# Patient Record
Sex: Female | Born: 1980 | Race: Black or African American | Hispanic: No | Marital: Single | Smoking: Never smoker
Health system: Southern US, Community
[De-identification: ages and names within clinical notes are randomized; demographics above are authoritative.]

## PROBLEM LIST (undated history)

## (undated) DIAGNOSIS — F32A Depression, unspecified: Secondary | ICD-10-CM

## (undated) DIAGNOSIS — F329 Major depressive disorder, single episode, unspecified: Secondary | ICD-10-CM

## (undated) DIAGNOSIS — T7840XA Allergy, unspecified, initial encounter: Secondary | ICD-10-CM

## (undated) DIAGNOSIS — F419 Anxiety disorder, unspecified: Secondary | ICD-10-CM

## (undated) DIAGNOSIS — D649 Anemia, unspecified: Secondary | ICD-10-CM

## (undated) HISTORY — DX: Anxiety disorder, unspecified: F41.9

## (undated) HISTORY — DX: Anemia, unspecified: D64.9

## (undated) HISTORY — DX: Major depressive disorder, single episode, unspecified: F32.9

## (undated) HISTORY — DX: Depression, unspecified: F32.A

## (undated) HISTORY — DX: Allergy, unspecified, initial encounter: T78.40XA

---

## 2000-11-16 ENCOUNTER — Emergency Department (HOSPITAL_COMMUNITY): Admission: EM | Admit: 2000-11-16 | Discharge: 2000-11-16 | Payer: Self-pay | Admitting: Emergency Medicine

## 2007-08-18 ENCOUNTER — Ambulatory Visit (HOSPITAL_COMMUNITY): Admission: RE | Admit: 2007-08-18 | Discharge: 2007-08-18 | Payer: Self-pay | Admitting: Family Medicine

## 2010-03-19 ENCOUNTER — Encounter: Payer: Self-pay | Admitting: Family Medicine

## 2012-09-30 ENCOUNTER — Encounter (HOSPITAL_COMMUNITY): Payer: Self-pay | Admitting: Emergency Medicine

## 2012-09-30 ENCOUNTER — Emergency Department (HOSPITAL_COMMUNITY)
Admission: EM | Admit: 2012-09-30 | Discharge: 2012-09-30 | Disposition: A | Discharge status: Home / Self Care | Payer: No Typology Code available for payment source | Attending: Emergency Medicine | Admitting: Emergency Medicine

## 2012-09-30 DIAGNOSIS — IMO0002 Reserved for concepts with insufficient information to code with codable children: Secondary | ICD-10-CM | POA: Insufficient documentation

## 2012-09-30 DIAGNOSIS — S8990XA Unspecified injury of unspecified lower leg, initial encounter: Secondary | ICD-10-CM | POA: Insufficient documentation

## 2012-09-30 DIAGNOSIS — Y92410 Unspecified street and highway as the place of occurrence of the external cause: Secondary | ICD-10-CM | POA: Insufficient documentation

## 2012-09-30 DIAGNOSIS — S46909A Unspecified injury of unspecified muscle, fascia and tendon at shoulder and upper arm level, unspecified arm, initial encounter: Secondary | ICD-10-CM | POA: Insufficient documentation

## 2012-09-30 DIAGNOSIS — S0990XA Unspecified injury of head, initial encounter: Secondary | ICD-10-CM | POA: Insufficient documentation

## 2012-09-30 DIAGNOSIS — S4980XA Other specified injuries of shoulder and upper arm, unspecified arm, initial encounter: Secondary | ICD-10-CM | POA: Insufficient documentation

## 2012-09-30 DIAGNOSIS — Y9389 Activity, other specified: Secondary | ICD-10-CM | POA: Insufficient documentation

## 2012-09-30 DIAGNOSIS — S0993XA Unspecified injury of face, initial encounter: Secondary | ICD-10-CM | POA: Insufficient documentation

## 2012-09-30 DIAGNOSIS — Z79899 Other long term (current) drug therapy: Secondary | ICD-10-CM | POA: Insufficient documentation

## 2012-09-30 MED ORDER — IBUPROFEN 800 MG PO TABS: 800.0000 mg | ORAL_TABLET | Freq: Three times a day (TID) | ORAL | Status: DC

## 2012-09-30 MED ORDER — METHOCARBAMOL 500 MG PO TABS: 500.0000 mg | ORAL_TABLET | Freq: Two times a day (BID) | ORAL | Status: DC
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
## 2012-09-30 NOTE — Progress Notes (Signed)
[facility] provided patient with a [employer].
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
## 2012-09-30 NOTE — ED Provider Notes (Signed)
Medical screening examination/treatment/procedure(s) were performed by non-physician practitioner and as supervising physician I was immediately available for consultation/collaboration.   Audree Camel, MD 09/30/12 [phone]

## 2012-12-17 ENCOUNTER — Encounter: Payer: Self-pay | Admitting: Family Medicine

## 2012-12-17 ENCOUNTER — Ambulatory Visit (INDEPENDENT_AMBULATORY_CARE_PROVIDER_SITE_OTHER): Payer: 59 | Admitting: Family Medicine

## 2012-12-17 ENCOUNTER — Encounter: Payer: Self-pay | Admitting: Internal Medicine

## 2012-12-17 VITALS — BP 102/60 | HR 80 | Temp 98.7°F | Resp 16 | Ht 62.0 in | Wt 123.8 lb

## 2012-12-17 DIAGNOSIS — Z862 Personal history of diseases of the blood and blood-forming organs and certain disorders involving the immune mechanism: Secondary | ICD-10-CM

## 2012-12-17 DIAGNOSIS — R5381 Other malaise: Secondary | ICD-10-CM

## 2012-12-17 DIAGNOSIS — K5900 Constipation, unspecified: Secondary | ICD-10-CM

## 2012-12-17 LAB — CBC WITH DIFFERENTIAL/PLATELET
Basophils Absolute: 0 10*3/uL (ref 0.0–0.1)
Basophils Relative: 1 % (ref 0–1)
Eosinophils Relative: 2 % (ref 0–5)
HCT: 36.8 % (ref 36.0–46.0)
Hemoglobin: 12.7 g/dL (ref 12.0–15.0)
Lymphocytes Relative: 38 % (ref 12–46)
Lymphs Abs: 2 10*3/uL (ref 0.7–4.0)
MCH: 28.9 pg (ref 26.0–34.0)
MCHC: 34.5 g/dL (ref 30.0–36.0)
Monocytes Absolute: 0.3 10*3/uL (ref 0.1–1.0)
Neutro Abs: 2.9 10*3/uL (ref 1.7–7.7)
Neutrophils Relative %: 54 % (ref 43–77)
RDW: 14 % (ref 11.5–15.5)
WBC: 5.3 10*3/uL (ref 4.0–10.5)

## 2012-12-17 LAB — POCT URINALYSIS DIPSTICK
Bilirubin, UA: NEGATIVE
Ketones, UA: NEGATIVE
Leukocytes, UA: NEGATIVE
Spec Grav, UA: <=1.005
pH, UA: 5.5

## 2012-12-17 NOTE — Patient Instructions (Addendum)
Fatigue Fatigue is a feeling of tiredness, lack of energy, lack of motivation, or feeling tired all the time. Having enough rest, good nutrition, and reducing stress will normally reduce fatigue. Consult your caregiver if it persists. The nature of your fatigue will help your caregiver to find out its cause. The treatment is based on the cause.  CAUSES  There are many causes for fatigue. Most of the time, fatigue can be traced to one or more of your habits or routines. Most causes fit into one or more of three general areas. They are: Lifestyle problems  Sleep disturbances.  Overwork.  Physical exertion.  Unhealthy habits.  Poor eating habits or eating disorders.  Alcohol and/or drug use .  Lack of proper nutrition (malnutrition). Psychological problems  Stress and/or anxiety problems.  Depression.  Grief.  Boredom. Medical Problems or Conditions  Anemia.  Pregnancy.  Thyroid gland problems.  Recovery from major surgery.  Continuous pain.  Emphysema or asthma that is not well controlled  Allergic conditions.  Diabetes.  Infections (such as mononucleosis).  Obesity.  Sleep disorders, such as sleep apnea.  Heart failure or other heart-related problems.  Cancer.  Kidney disease.  Liver disease.  Effects of certain medicines such as antihistamines, cough and cold remedies, prescription pain medicines, heart and blood pressure medicines, drugs used for treatment of cancer, and some antidepressants. SYMPTOMS  The symptoms of fatigue include:   Lack of energy.  Lack of drive (motivation).  Drowsiness.  Feeling of indifference to the surroundings. DIAGNOSIS  The details of how you feel help guide your caregiver in finding out what is causing the fatigue. You will be asked about your present and past health condition. It is important to review all medicines that you take, including prescription and non-prescription items. A thorough exam will be done.  You will be questioned about your feelings, habits, and normal lifestyle. Your caregiver may suggest blood tests, urine tests, or other tests to look for common medical causes of fatigue.  TREATMENT  Fatigue is treated by correcting the underlying cause. For example, if you have continuous pain or depression, treating these causes will improve how you feel. Similarly, adjusting the dose of certain medicines will help in reducing fatigue.  HOME CARE INSTRUCTIONS   Try to get the required amount of good sleep every night.  Eat a healthy and nutritious diet, and drink enough water throughout the day.  Practice ways of relaxing (including yoga or meditation).  Exercise regularly.  Make plans to change situations that cause stress. Act on those plans so that stresses decrease over time. Keep your work and personal routine reasonable.  Avoid street drugs and minimize use of alcohol.  Start taking a daily multivitamin after consulting your caregiver. SEEK MEDICAL CARE IF:   You have persistent tiredness, which cannot be accounted for.  You have fever.  You have unintentional weight loss.  You have headaches.  You have disturbed sleep throughout the night.  You are feeling sad.  You have constipation.  You have dry skin.  You have gained weight.  You are taking any new or different medicines that you suspect are causing fatigue.  You are unable to sleep at night.  You develop any unusual swelling of your legs or other parts of your body. SEEK IMMEDIATE MEDICAL CARE IF:   You are feeling confused.  Your vision is blurred.  You feel faint or pass out.  You develop severe headache.  You develop severe abdominal, pelvic, or   back pain.  You develop chest pain, shortness of breath, or an irregular or fast heartbeat.  You are unable to pass a normal amount of urine.  You develop abnormal bleeding such as bleeding from the rectum or you vomit blood.  You have thoughts  about harming yourself or committing suicide.  You are worried that you might harm someone else. MAKE SURE YOU:   Understand these instructions.  Will watch your condition.  Will get help right away if you are not doing well or get worse. Document Released: 12/10/2006 Document Revised: 05/07/2011 Document Reviewed: 12/10/2006 [facility] Patient Information 2014 [place], [place].    Constipation, Adult Constipation is when a person has fewer than 3 bowel movements a week; has difficulty having a bowel movement; or has stools that are dry, hard, or larger than normal. As people grow older, constipation is more common. If you try to fix constipation with medicines that make you have a bowel movement (laxatives), the problem may get worse. Long-term laxative use may cause the muscles of the colon to become weak. A low-fiber diet, not taking in enough fluids, and taking certain medicines may make constipation worse. CAUSES   Certain medicines, such as antidepressants, pain medicine, iron supplements, antacids, and water pills.   Certain diseases, such as diabetes, irritable bowel syndrome (IBS), thyroid disease, or depression.   Not drinking enough water.   Not eating enough fiber-rich foods.   Stress or travel.  Lack of physical activity or exercise.  Not going to the restroom when there is the urge to have a bowel movement.  Ignoring the urge to have a bowel movement.  Using laxatives too much. SYMPTOMS   Having fewer than 3 bowel movements a week.   Straining to have a bowel movement.   Having hard, dry, or larger than normal stools.   Feeling full or bloated.   Pain in the lower abdomen.  Not feeling relief after having a bowel movement. DIAGNOSIS  Your caregiver will take a medical history and perform a physical exam. Further testing may be done for severe constipation. Some tests may include:   A barium enema X-ray to examine your rectum, colon, and  sometimes, your small intestine.  A sigmoidoscopy to examine your lower colon.  A colonoscopy to examine your entire colon. TREATMENT  Treatment will depend on the severity of your constipation and what is causing it. Some dietary treatments include drinking more fluids and eating more fiber-rich foods. Lifestyle treatments may include regular exercise. If these diet and lifestyle recommendations do not help, your caregiver may recommend taking over-the-counter laxative medicines to help you have bowel movements. Prescription medicines may be prescribed if over-the-counter medicines do not work.  HOME CARE INSTRUCTIONS   Increase dietary fiber in your diet, such as fruits, vegetables, whole grains, and beans. Limit high-fat and processed sugars in your diet, such as [place] fries, hamburgers, cookies, candies, and soda.   A fiber supplement may be added to your diet if you cannot get enough fiber from foods.   Drink enough fluids to keep your urine clear or pale yellow.   Exercise regularly or as directed by your caregiver.   Go to the restroom when you have the urge to go. Do not hold it.  Only take medicines as directed by your caregiver. Do not take other medicines for constipation without talking to your caregiver first. SEEK IMMEDIATE MEDICAL CARE IF:   You have bright red blood in your stool.   Your constipation  lasts for more than 4 days or gets worse.   You have abdominal or rectal pain.   You have thin, pencil-like stools.  You have unexplained weight loss. MAKE SURE YOU:   Understand these instructions.  Will watch your condition.  Will get help right away if you are not doing well or get worse. Document Released: 11/11/2003 Document Revised: 05/07/2011 Document Reviewed: 01/16/2011 [facility] Patient Information 2014 [place], [place].

## 2012-12-18 LAB — COMPREHENSIVE METABOLIC PANEL
ALT: <8 U/L (ref 0–35)
AST: 11 U/L (ref 0–37)
Albumin: 4.2 g/dL (ref 3.5–5.2)
Alkaline Phosphatase: 133 U/L — ABNORMAL HIGH (ref 39–117)
BUN: 6 mg/dL (ref 6–23)
Calcium: 9.1 mg/dL (ref 8.4–10.5)
Chloride: 105 mEq/L (ref 96–112)
Potassium: 4.3 mEq/L (ref 3.5–5.3)
Sodium: 138 mEq/L (ref 135–145)
Total Bilirubin: 0.5 mg/dL (ref 0.3–1.2)
Total Protein: 7.6 g/dL (ref 6.0–8.3)

## 2012-12-18 LAB — VITAMIN D 25 HYDROXY (VIT D DEFICIENCY, FRACTURES): Vit D, 25-Hydroxy: 16 ng/mL — ABNORMAL LOW (ref 30–89)

## 2012-12-18 NOTE — Progress Notes (Signed)
Subjective:    Patient ID: Brooke Sexton, female    DOB: Jun 15, 1980, 32 y.o.   MRN: [id number]  HPI  This 32 y.o. AA female is here to re-establish care; last CPE/PAP ~5 years ago. Pt is concerned about fatigue, "digestive problems" and chronic constipation and hair loss which runs in her family among female relatives.  Menses are regular w/ normal bleeding; pt has hx of anemia in remote past.  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 SHe acknowledges that it is a stressful profession.  PMHx, Soc Hx and Fam Hx reviewed.  Review of Systems  Constitutional: Positive for fatigue. Negative for fever, diaphoresis, activity change, appetite change and unexpected weight change.  HENT: Negative.   Eyes: Negative.   Respiratory: Negative.   Cardiovascular: Negative.   Gastrointestinal: Positive for constipation and abdominal distention. Negative for nausea, vomiting, diarrhea and blood in stool.  Endocrine: Positive for cold intolerance.  Genitourinary: Negative.   Musculoskeletal: Negative.   Skin: Negative.   Neurological: Negative.   Hematological: Negative.   Psychiatric/Behavioral: Negative.        Objective:   Physical Exam  Nursing note and vitals reviewed. Constitutional: She is oriented to person, place, and time. Vital signs are normal. She appears well-developed and well-nourished. No distress.  HENT:  Head: Normocephalic and atraumatic.  Right Ear: Hearing, tympanic membrane, external ear and ear canal normal.  Left Ear: Hearing, tympanic membrane, external ear and ear canal normal.  Nose: Nose normal. No nasal deformity or septal deviation. Right sinus exhibits no maxillary sinus tenderness and no frontal sinus tenderness. Left sinus exhibits no maxillary sinus tenderness and no frontal sinus tenderness.  Mouth/Throat: Uvula is midline, oropharynx is clear and moist and mucous membranes are normal. No oral lesions. Normal  dentition. No dental caries.  Eyes: Conjunctivae, EOM and lids are normal. No scleral icterus.  Fundoscopic exam:      The right eye shows no arteriolar narrowing, no AV nicking and no papilledema. The right eye shows red reflex.       The left eye shows no arteriolar narrowing, no AV nicking and no papilledema. The left eye shows red reflex.  Neck: Normal range of motion and full passive range of motion without pain. Neck supple. No spinous process tenderness and no muscular tenderness present. No mass and no thyromegaly present.  Cardiovascular: Normal rate, regular rhythm, S1 normal, S2 normal, normal heart sounds, intact distal pulses and normal pulses.   No extrasystoles are present. PMI is not displaced.  Exam reveals no gallop and no friction rub.   No murmur heard. Pulmonary/Chest: Effort normal and breath sounds normal. No respiratory distress.  Abdominal: Soft. Normal appearance and bowel sounds are normal. She exhibits no distension and no mass. There is no tenderness. There is no guarding and no CVA tenderness.  Musculoskeletal: Normal range of motion. She exhibits no edema and no tenderness.  Lymphadenopathy:       Head (right side): No submental, no submandibular, no tonsillar, no posterior auricular and no occipital adenopathy present.       Head (left side): No submental, no submandibular, no tonsillar, no preauricular and no occipital adenopathy present.    She has no cervical adenopathy.    She has no axillary adenopathy.       Right: No inguinal and no supraclavicular adenopathy present.       Left: No inguinal and no supraclavicular adenopathy present.  Neurological:  She is alert and oriented to person, place, and time. She has normal strength. She displays no atrophy and no tremor. No cranial nerve deficit or sensory deficit. She exhibits normal muscle tone. She displays a negative Romberg sign. Coordination and gait normal.  Reflex Scores:      Tricep reflexes are 2+ on the  right side and 2+ on the left side.      Bicep reflexes are 3+ on the right side and 3+ on the left side.      Patellar reflexes are 3+ on the right side and 3+ on the left side. Skin: Skin is warm, dry and intact. No bruising, no ecchymosis, no petechiae and no rash noted. She is not diaphoretic. No cyanosis or erythema. Nails show no clubbing.  Psychiatric: She has a normal mood and affect. Her speech is normal and behavior is normal. Judgment and thought content normal. Cognition and memory are normal.       Assessment & Plan:  Other malaise and fatigue - Plan: POCT urinalysis dipstick, Vit D  25 hydroxy (rtn osteoporosis monitoring), CBC with Differential, Comprehensive metabolic panel, TSH, T3, free  Unspecified constipation

## 2012-12-23 ENCOUNTER — Other Ambulatory Visit: Payer: Self-pay | Admitting: Family Medicine

## 2012-12-23 ENCOUNTER — Encounter: Payer: Self-pay | Admitting: Family Medicine

## 2012-12-23 MED ORDER — ERGOCALCIFEROL 1.25 MG (50000 UT) PO CAPS: 50000.0000 [IU] | ORAL_CAPSULE | ORAL | Status: AC

## 2012-12-24 NOTE — Telephone Encounter (Signed)
Pt called because she couldn't sign into MyChart to view her labs-pt notified of labs and copy sent so that she could take a copy with her to Dr. Rhea Belton.

## 2013-01-01 ENCOUNTER — Other Ambulatory Visit: Payer: Self-pay

## 2013-01-21 ENCOUNTER — Encounter: Payer: Self-pay | Admitting: Internal Medicine

## 2013-01-21 ENCOUNTER — Ambulatory Visit (INDEPENDENT_AMBULATORY_CARE_PROVIDER_SITE_OTHER): Payer: 59 | Admitting: Internal Medicine

## 2013-01-21 VITALS — BP 112/60 | HR 72 | Ht 62.0 in | Wt 123.0 lb

## 2013-01-21 DIAGNOSIS — E739 Lactose intolerance, unspecified: Secondary | ICD-10-CM

## 2013-01-21 DIAGNOSIS — K5900 Constipation, unspecified: Secondary | ICD-10-CM

## 2013-01-21 DIAGNOSIS — R141 Gas pain: Secondary | ICD-10-CM

## 2013-01-21 DIAGNOSIS — E559 Vitamin D deficiency, unspecified: Secondary | ICD-10-CM

## 2013-01-21 DIAGNOSIS — R140 Abdominal distension (gaseous): Secondary | ICD-10-CM

## 2013-01-21 MED ORDER — LINACLOTIDE 145 MCG PO CAPS: 145.0000 ug | ORAL_CAPSULE | Freq: Every day | ORAL | Status: DC

## 2013-01-21 NOTE — Patient Instructions (Signed)
You have been scheduled for a Flexible sigmoidoscopy with propofol. Please follow written instructions given to you at your visit today.  Please pick up your prep kit at the pharmacy within the next 1-3 days. If you use inhalers (even only as needed), please bring them with you on the day of your procedure. Your physician has requested that you go to www.startemmi.com and enter the access code given to you at your visit today. This web site gives a general overview about your procedure. However, you should still follow specific instructions given to you by our office regarding your preparation for the procedure.  We have sent the following medications to your pharmacy for you to pick up at your convenience: linzess 145 mcg. Take 1 capsule daily with food

## 2013-01-21 NOTE — Progress Notes (Signed)
Patient ID: Brooke Sexton, female   DOB: 05-07-1980, 32 y.o.   MRN: [id number] HPI: Brooke Sexton is a 32 year old female with a past medical history of anxiety and depression who is seen to evaluate constipation and abdominal bloating. She is here alone today. She reports a somewhat long-standing history of constipation which for her kidney and one bowel movement every one to 2 weeks. She has been trying natural laxative such as warm on water which seemed to help induce bowel movement approximately twice per week. She feels at times incomplete evacuation and has hard stools. She can occasionally see bright red blood in her stool and with wiping. She reports this is very rare. She denies abdominal pain but does feel abdominal bloating and fullness on most days. This is worse after eating. She feels at times that there is "no room for food". She denies nausea or vomiting, heartburn, dysphagia or odynophagia. She does know that dairy foods make her bloating worse and can actually give her loose stools so she avoids them. No family history of colorectal cancer or IBD. She has kept a food diary which he brings today and she has been unable to identify any specific triggers in her diet that cause her bloating other than the lactulose as previously described. He has tried over-the-counter Gas-X which helps some. Nothing pharmacologic for constipation  Past Medical History  Diagnosis Date  . Allergy   . Depression   . Anemia   . Anxiety     History reviewed. No pertinent past surgical history.  Current Outpatient Prescriptions  Medication Sig Dispense Refill  . ergocalciferol (DRISDOL) 50000 UNITS capsule Take 1 capsule (50,000 Units total) by mouth once a week.  4 capsule  5  . Simethicone (GAS-X PO) Take by mouth as needed.       No current facility-administered medications for this visit.    No Known Allergies  Family History  Problem Relation Age of Onset  . Diabetes Mother   . Hypertension  Mother   . Mental illness Mother   . Mental illness Father   . Breast cancer Paternal Grandmother   . Diabetes Paternal Grandfather   . Colon cancer Neg Hx     History  Substance Use Topics  . Smoking status: Never Smoker   . Smokeless tobacco: Never Used  . Alcohol Use: No    ROS: As per history of present illness, otherwise negative  BP 112/60  Pulse 72  Ht 5\' 2"  (1.575 m)  Wt 123 lb (55.792 kg)  BMI 22.49 kg/m2 Constitutional: Well-developed and well-nourished. No distress. HEENT: Normocephalic and atraumatic. Oropharynx is clear and moist. No oropharyngeal exudate. Conjunctivae are normal.  No scleral icterus. Cardiovascular: Normal rate, regular rhythm and intact distal pulses. No M/R/G Pulmonary/chest: Effort normal and breath sounds normal. No wheezing, rales or rhonchi. Abdominal: Soft, nontender, nondistended. Bowel sounds active throughout. There are no masses palpable. No hepatosplenomegaly. Extremities: no clubbing, cyanosis, or edema Lymphadenopathy: No cervical adenopathy noted. Neurological: Alert and oriented to person place and time. Skin: Skin is warm and dry. No rashes noted. Psychiatric: Normal mood and affect. Behavior is normal.  RELEVANT LABS AND IMAGING: CBC    Component Value Date/Time   WBC 5.3 12/17/2012 1100   RBC 4.39 12/17/2012 1100   HGB 12.7 12/17/2012 1100   HCT 36.8 12/17/2012 1100   PLT 300 12/17/2012 1100   MCV 83.8 12/17/2012 1100   MCH 28.9 12/17/2012 1100   MCHC 34.5 12/17/2012 1100  RDW 14.0 12/17/2012 1100   LYMPHSABS 2.0 12/17/2012 1100   MONOABS 0.3 12/17/2012 1100   EOSABS 0.1 12/17/2012 1100   BASOSABS 0.0 12/17/2012 1100    CMP     Component Value Date/Time   NA 138 12/17/2012 1100   K 4.3 12/17/2012 1100   CL 105 12/17/2012 1100   CO2 25 12/17/2012 1100   GLUCOSE 85 12/17/2012 1100   BUN 6 12/17/2012 1100   CREATININE 0.61 12/17/2012 1100   CALCIUM 9.1 12/17/2012 1100   PROT 7.6 12/17/2012 1100   ALBUMIN  4.2 12/17/2012 1100   AST 11 12/17/2012 1100   ALT <8 12/17/2012 1100   ALKPHOS 133* 12/17/2012 1100   BILITOT 0.5 12/17/2012 1100   Vit D = 16  TSH normal  ASSESSMENT/PLAN: 32 year old female with a past medical history of anxiety and depression who is seen to evaluate constipation and abdominal bloating.  1.  Constipation/abd bloating -- her symptoms seem consistent with irritable bowel constipation predominant. She is having intermittent red blood per rectum which would not be consistent with IBS. For this reason I have recommended flexible sigmoidoscopy to rule out left-sided pathology. I've also recommended that she give a trial to Linzess 145 mcg daily. We discussed the possible side effects of diarrhea or nausea, and I asked that if this occurs and she notify me. She voices understanding. I've also recommended the FODMAP diet to help reduce gas/bloating  2.  vitamin D deficiency -- being supplemented per primary care  3.  Lactose intolerance -- her symptoms are consistent with lactose intolerance. We discussed lactose avoidance and other lactose free products. If she is eating lactose I recommended Lactaid pills as directed per box instruction.

## 2013-02-23 ENCOUNTER — Other Ambulatory Visit: Payer: 59 | Admitting: Internal Medicine

## 2013-03-25 ENCOUNTER — Encounter: Payer: 59 | Admitting: Family Medicine

## 2013-05-07 ENCOUNTER — Encounter: Payer: Self-pay | Admitting: Family Medicine

## 2013-05-07 ENCOUNTER — Ambulatory Visit (INDEPENDENT_AMBULATORY_CARE_PROVIDER_SITE_OTHER): Payer: 59 | Admitting: Family Medicine

## 2013-05-07 VITALS — BP 100/62 | HR 73 | Temp 98.8°F | Resp 16 | Ht 62.0 in | Wt 123.4 lb

## 2013-05-07 DIAGNOSIS — K5909 Other constipation: Secondary | ICD-10-CM

## 2013-05-07 DIAGNOSIS — K5900 Constipation, unspecified: Secondary | ICD-10-CM

## 2013-05-07 DIAGNOSIS — Z0000 Encounter for general adult medical examination without abnormal findings: Secondary | ICD-10-CM

## 2013-05-07 DIAGNOSIS — Z8639 Personal history of other endocrine, nutritional and metabolic disease: Secondary | ICD-10-CM

## 2013-05-07 DIAGNOSIS — Z124 Encounter for screening for malignant neoplasm of cervix: Secondary | ICD-10-CM

## 2013-05-07 DIAGNOSIS — Z01419 Encounter for gynecological examination (general) (routine) without abnormal findings: Secondary | ICD-10-CM

## 2013-05-07 NOTE — Patient Instructions (Signed)
Keeping You Healthy  Get These Tests 1. Blood Pressure- Have your blood pressure checked once a year by your health care provider.  Normal blood pressure is 120/80. 2. Weight- Have your body mass index (BMI) calculated to screen for obesity.  BMI is measure of body fat based on height and weight.  You can also calculate your own BMI at https://www.west-esparza.com/. 3. Cholesterol- Have your cholesterol checked every 5 years starting at age 34 then yearly starting at age 94. 4. Chlamydia, HIV, and other sexually transmitted diseases- Get screened every year until age 95, then within three months of each new sexual provider. 5. Pap Smear- Every 1-3 years; discuss with your health care provider. 6. Mammogram- Every year starting at age 82  Take these medicines  Calcium with Vitamin D-Your body needs 1200 mg of Calcium each day and [phone] IU of Vitamin D daily.  Your body can only absorb 500 mg of Calcium at a time so Calcium must be taken in 2 or 3 divided doses throughout the day.  Multivitamin with folic acid- Once daily if it is possible for you to become pregnant.  Get these Immunizations  Gardasil-Series of three doses; prevents HPV related illness such as genital warts and cervical cancer.  Menactra-Single dose; prevents meningitis.  Tetanus shot- Every 10 years.  Flu shot-Every year.  Take these steps 1. Do not smoke-Your healthcare provider can help you quit.  For tips on how to quit go to www.smokefree.gov or call 1-800 QUITNOW. 2. Be physically active- Exercise 5 days a week for at least 30 minutes.  If you are not already physically active, start slow and gradually work up to 30 minutes of moderate physical activity.  Examples of moderate activity include walking briskly, dancing, swimming, bicycling, etc. 3. Breast Cancer- A self breast exam every month is important for early detection of breast cancer.  For more information and instruction on self breast exams, ask your  healthcare provider or [email or web address].[email or web address]. 4. Eat a healthy diet- Eat a variety of healthy foods such as fruits, vegetables, whole grains, low fat milk, low fat cheeses, yogurt, lean meats, poultry and fish, beans, nuts, tofu, etc.  For more information go to www. Thenutritionsource.org 5. Drink alcohol in moderation- Limit alcohol intake to one drink or less per day. Never drink and drive. 6. Depression- Your emotional health is as important as your physical health.  If you're feeling down or losing interest in things you normally enjoy please talk to your healthcare provider about being screened for depression. 7. Dental visit- Brush and floss your teeth twice daily; visit your dentist twice a year. 8. Eye doctor- Get an eye exam at least every 2 years. 9. Helmet use- Always wear a helmet when riding a bicycle, motorcycle, rollerblading or skateboarding. 10. Safe sex- If you may be exposed to sexually transmitted infections, use a condom. 11. Seat belts- Seat belts can save your live; always wear one. 12. Smoke/Carbon Monoxide detectors- These detectors need to be installed on the appropriate level of your home. Replace batteries at least once a year. 13. Skin cancer- When out in the sun please cover up and use sunscreen 15 SPF or higher. 14. Violence- If anyone is threatening or hurting you, please tell your healthcare provider.   Constipation, Adult Constipation is when a person:  Poops (bowel movement) less than 3 times a week.  Has a hard time pooping.  Has poop that is dry, hard, or bigger than normal. HOME CARE  Eat more fiber, such as fruits, vegetables, whole grains like brown rice, and beans.  Eat less fatty foods and sugar. This includes [place] fries, hamburgers, cookies, candy, and soda.  If you are not getting enough fiber from food, take products with added fiber in them (supplements).  Drink enough fluid to keep your pee (urine) clear or pale  yellow.  Go to the restroom when you feel like you need to poop. Do not hold it.  Only take medicine as told by your doctor. Do not take medicines that help you poop (laxatives) without talking to your doctor first.  Exercise on a regular basis, or as told by your doctor. GET HELP RIGHT AWAY IF:   You have bright red blood in your poop (stool).  Your constipation lasts more than 4 days or gets worse.  You have belly (abdomen) or butt (rectal) pain.  You have thin poop (as thin as a pencil).  You lose weight, and it cannot be explained. MAKE SURE YOU:   Understand these instructions.  Will watch your condition.  Will get help right away if you are not doing well or get worse. Document Released: 08/01/2007 Document Revised: 05/07/2011 Document Reviewed: 11/24/2012 [facility] Patient Information 2014 [place], [place].   Fiber Content in Foods Drinking plenty of fluids and consuming foods high in fiber can help with constipation. See the list below for the fiber content of some common foods. Starches and Grains / Dietary Fiber (g)  Cheerios, 1 cup / 3 g  Kellogg's Corn Flakes, 1 cup / 0.7 g  Rice Krispies, 1  cup / 0.3 g  Quaker Oat Life Cereal,  cup / 2.1 g  Oatmeal, instant (cooked),  cup / 2 g  Kellogg's Frosted Mini Wheats, 1 cup / 5.1 g  Rice, brown, long-grain (cooked), 1 cup / 3.5 g  Rice, white, long-grain (cooked), 1 cup / 0.6 g  Macaroni, cooked, enriched, 1 cup / 2.5 g Legumes / Dietary Fiber (g)  Beans, baked, canned, plain or vegetarian,  cup / 5.2 g  Beans, kidney, canned,  cup / 6.8 g  Beans, pinto, dried (cooked),  cup / 7.7 g  Beans, pinto, canned,  cup / 5.5 g Breads and Crackers / Dietary Fiber (g)  Graham crackers, plain or honey, 2 squares / 0.7 g  Saltine crackers, 3 squares / 0.3 g  Pretzels, plain, salted, 10 pieces / 1.8 g  Bread, whole-wheat, 1 slice / 1.9 g  Bread, white, 1 slice / 0.7 g  Bread, raisin, 1 slice / 1.2  g  Bagel, plain, 3 oz / 2 g  Tortilla, flour, 1 oz / 0.9 g  Tortilla, corn, 1 small / 1.5 g  Bun, hamburger or hotdog, 1 small / 0.9 g Fruits / Dietary Fiber (g)  Apple, raw with skin, 1 medium / 4.4 g  Applesauce, sweetened,  cup / 1.5 g  Banana,  medium / 1.5 g  Grapes, 10 grapes / 0.4 g  Orange, 1 small / 2.3 g  Raisin, 1.5 oz / 1.6 g  Melon, 1 cup / 1.4 g Vegetables / Dietary Fiber (g)  Green beans, canned,  cup / 1.3 g  Carrots (cooked),  cup / 2.3 g  Broccoli (cooked),  cup / 2.8 g  Peas, frozen (cooked),  cup / 4.4 g  Potatoes, mashed,  cup / 1.6 g  Lettuce, 1 cup / 0.5 g  Corn, canned,  cup / 1.6 g  Tomato,  cup / 1.1  g Document Released: 07/01/2006 Document Revised: 05/07/2011 Document Reviewed: 08/26/2006 [facility] Patient Information 2014 [place], [place].   For constipation and bloating- try an over-the-counter Probiotic- there are some gummie supplements that are easy to digest and will improve your digestive function.

## 2013-05-07 NOTE — Progress Notes (Signed)
Subjective:    Patient ID: SANA TESSMER, female    DOB: [birth year], 33 y.o.   MRN: [id number]  HPI  This 33 y.o. AA female is here for CPE/PAP. She has normal menses and is not sexually active for several years. Last PAP > 3 years ago (negative). Only chronic medical problem is constipation w/ bloating, eval by GI specialist and treated w/ Linzess. Pt states this is not working and she has scheduled a return visit for re-assessment.  Patient Active Problem List   Diagnosis Date Noted  . Vitamin D deficiency 01/21/2013  . History of anemia 12/17/2012  . Unspecified constipation 12/17/2012   Prior to Admission medications   Medication Sig Start Date End Date Taking? Authorizing Provider  Simethicone (GAS-X PO) Take by mouth as needed.   Yes Historical Provider, MD  ergocalciferol (DRISDOL) 50000 UNITS capsule Take 1 capsule (50,000 Units total) by mouth once a week. 12/23/12 12/23/13  Maurice March, MD  Linaclotide [facility]) 145 MCG CAPS capsule Take 1 capsule (145 mcg total) by mouth daily. 01/21/13   Beverley Fiedler, MD   PMHx, Surg Hx, Soc and Fam Hx reviewed.   Review of Systems  Constitutional: Positive for fatigue.  HENT: Negative.   Eyes: Negative.   Respiratory: Negative.   Cardiovascular: Negative.   Gastrointestinal: Positive for constipation and abdominal distention. Negative for nausea, vomiting, abdominal pain, diarrhea and blood in stool.  Endocrine: Negative.   Genitourinary: Negative.   Musculoskeletal: Negative.   Skin: Negative.   Allergic/Immunologic: Negative.   Neurological: Negative.   Hematological: Negative.   Psychiatric/Behavioral: Negative.       Objective:   Physical Exam  Nursing note and vitals reviewed. Constitutional: She is oriented to person, place, and time. Vital signs are normal. She appears well-developed and well-nourished. No distress.  HENT:  Head: Normocephalic and atraumatic.  Right Ear: Hearing, tympanic membrane, external  ear and ear canal normal.  Left Ear: Hearing, tympanic membrane, external ear and ear canal normal.  Nose: Nose normal. No nasal deformity or septal deviation.  Mouth/Throat: Uvula is midline, oropharynx is clear and moist and mucous membranes are normal. No oral lesions. Normal dentition. No dental caries.  Eyes: Conjunctivae, EOM and lids are normal. Pupils are equal, round, and reactive to light. No scleral icterus.  Neck: Trachea normal, normal range of motion and full passive range of motion without pain. Neck supple. No spinous process tenderness and no muscular tenderness present. No mass and no thyromegaly present.  Cardiovascular: Normal rate, regular rhythm, S1 normal, S2 normal, normal heart sounds, intact distal pulses and normal pulses.   No extrasystoles are present. PMI is not displaced.  Exam reveals no gallop and no friction rub.   No murmur heard. Pulmonary/Chest: Effort normal and breath sounds normal. No respiratory distress. Right breast exhibits no inverted nipple, no mass, no nipple discharge, no skin change and no tenderness. Left breast exhibits no inverted nipple, no mass, no nipple discharge, no skin change and no tenderness. Breasts are symmetrical.  Abdominal: Soft. Normal appearance. She exhibits no distension, no pulsatile midline mass and no mass. Bowel sounds are increased. There is no hepatosplenomegaly. There is no tenderness. There is no guarding and no CVA tenderness. No hernia.  Genitourinary: Vagina normal and uterus normal. Rectal exam shows no external hemorrhoid and no fissure. There is no rash, tenderness or lesion on the right labia. There is no rash, tenderness or lesion on the left labia. Uterus is not deviated,  not enlarged, not fixed and not tender. Cervix exhibits no motion tenderness, no discharge and no friability. Right adnexum displays no mass, no tenderness and no fullness. Left adnexum displays no mass, no tenderness and no fullness. No erythema,  tenderness or bleeding around the vagina. No foreign body around the vagina. No signs of injury around the vagina. No vaginal discharge found.  Musculoskeletal: Normal range of motion. She exhibits no edema and no tenderness.       Cervical back: Normal.       Thoracic back: Normal.       Lumbar back: Normal.  Lymphadenopathy:       Head (right side): No submental, no submandibular, no tonsillar, no preauricular, no posterior auricular and no occipital adenopathy present.       Head (left side): No submental, no submandibular, no tonsillar, no preauricular, no posterior auricular and no occipital adenopathy present.    She has no cervical adenopathy.    She has no axillary adenopathy.       Right: No inguinal and no supraclavicular adenopathy present.       Left: No inguinal and no supraclavicular adenopathy present.  Neurological: She is alert and oriented to person, place, and time. She has normal strength and normal reflexes. She displays no atrophy and no tremor. No cranial nerve deficit or sensory deficit. She exhibits normal muscle tone. She displays a negative Romberg sign. Coordination and gait normal.  Skin: Skin is warm, dry and intact. No bruising, no lesion and no rash noted. She is not diaphoretic. No cyanosis or erythema. Nails show no clubbing.  Psychiatric: She has a normal mood and affect. Her speech is normal and behavior is normal. Judgment and thought content normal. Cognition and memory are normal.  Affect somewhat flat.       Assessment & Plan:  Routine general medical examination at a health care facility  Encounter for cervical Pap smear with pelvic exam - Plan: Pap IG w/ reflex to HPV when ASC-U  History of vitamin D deficiency - Pt advised about OTC vitamin D 1000- 2000 units per days or most days of the week.          Plan: Vitamin D, 25-hydroxy  Constipation, chronic- Pt scheduled to return to GI Specialist.

## 2013-05-08 ENCOUNTER — Encounter: Payer: Self-pay | Admitting: Family Medicine

## 2013-05-08 LAB — PAP IG W/ RFLX HPV ASCU

## 2013-05-08 LAB — VITAMIN D 25 HYDROXY (VIT D DEFICIENCY, FRACTURES): Vit D, 25-Hydroxy: 37 ng/mL (ref 30–89)

## 2013-05-12 ENCOUNTER — Telehealth: Payer: Self-pay

## 2013-05-12 NOTE — Telephone Encounter (Signed)
Results have been released to mychart. Advised pt results.

## 2013-05-12 NOTE — Telephone Encounter (Signed)
PT HAD A VITAMIN D TEST RAN AND WOULD LIKE TO KNOW ABOUT RESULTS. PLEASE CALL [phone]

## 2013-11-04 ENCOUNTER — Ambulatory Visit: Payer: 59 | Admitting: Family Medicine

## 2013-11-06 ENCOUNTER — Encounter: Payer: Self-pay | Admitting: Family Medicine

## 2013-11-06 ENCOUNTER — Other Ambulatory Visit: Payer: Self-pay | Admitting: Family Medicine

## 2013-11-06 ENCOUNTER — Ambulatory Visit (INDEPENDENT_AMBULATORY_CARE_PROVIDER_SITE_OTHER): Payer: 59

## 2013-11-06 ENCOUNTER — Ambulatory Visit (INDEPENDENT_AMBULATORY_CARE_PROVIDER_SITE_OTHER): Payer: 59 | Admitting: Family Medicine

## 2013-11-06 VITALS — BP 106/62 | HR 66 | Temp 98.6°F | Resp 16 | Ht 62.75 in | Wt 126.2 lb

## 2013-11-06 DIAGNOSIS — R102 Pelvic and perineal pain: Secondary | ICD-10-CM

## 2013-11-06 DIAGNOSIS — M545 Low back pain, unspecified: Secondary | ICD-10-CM

## 2013-11-06 DIAGNOSIS — N949 Unspecified condition associated with female genital organs and menstrual cycle: Secondary | ICD-10-CM

## 2013-11-06 DIAGNOSIS — N9489 Other specified conditions associated with female genital organs and menstrual cycle: Secondary | ICD-10-CM

## 2013-11-06 DIAGNOSIS — Z8742 Personal history of other diseases of the female genital tract: Secondary | ICD-10-CM

## 2013-11-06 MED ORDER — MELOXICAM 7.5 MG PO TABS: 7.5000 mg | ORAL_TABLET | Freq: Every day | ORAL | Status: DC

## 2013-11-06 MED ORDER — CYCLOBENZAPRINE HCL 5 MG PO TABS: 5.0000 mg | ORAL_TABLET | Freq: Every day | ORAL | Status: DC

## 2013-11-06 NOTE — Patient Instructions (Signed)
Pelvic Pain Female pelvic pain can be caused by many different things and start from a variety of places. Pelvic pain refers to pain that is located in the lower half of the abdomen and between your hips. The pain may occur over a short period of time (acute) or may be reoccurring (chronic). The cause of pelvic pain may be related to disorders affecting the female reproductive organs (gynecologic), but it may also be related to the bladder, kidney stones, an intestinal complication, or muscle or skeletal problems. Getting help right away for pelvic pain is important, especially if there has been severe, sharp, or a sudden onset of unusual pain. It is also important to get help right away because some types of pelvic pain can be life threatening.  CAUSES  Below are only some of the causes of pelvic pain. The causes of pelvic pain can be in one of several categories.   Gynecologic.  Pelvic inflammatory disease.  Sexually transmitted infection.  Ovarian cyst or a twisted ovarian ligament (ovarian torsion).  Uterine lining that grows outside the uterus (endometriosis).  Fibroids, cysts, or tumors.  Ovulation.  Pregnancy.  Pregnancy that occurs outside the uterus (ectopic pregnancy).  Miscarriage.  Labor.  Abruption of the placenta or ruptured uterus.  Infection.  Uterine infection (endometritis).  Bladder infection.  Diverticulitis.  Miscarriage related to a uterine infection (septic abortion).  Bladder.  Inflammation of the bladder (cystitis).  Kidney stone(s).  Gastrointestinal.  Constipation.  Diverticulitis.  Neurologic.  Trauma.  Feeling pelvic pain because of mental or emotional causes (psychosomatic).  Cancers of the bowel or pelvis. EVALUATION  Your caregiver will want to take a careful history of your concerns. This includes recent changes in your health, a careful gynecologic history of your periods (menses), and a sexual history. Obtaining your family  history and medical history is also important. Your caregiver may suggest a pelvic exam. A pelvic exam will help identify the location and severity of the pain. It also helps in the evaluation of which organ system may be involved. In order to identify the cause of the pelvic pain and be properly treated, your caregiver may order tests. These tests may include:   A pregnancy test.  Pelvic ultrasonography.  An X-ray exam of the abdomen.  A urinalysis or evaluation of vaginal discharge.  Blood tests. HOME CARE INSTRUCTIONS   Only take over-the-counter or prescription medicines for pain, discomfort, or fever as directed by your caregiver.   Rest as directed by your caregiver.   Eat a balanced diet.   Drink enough fluids to make your urine clear or pale yellow, or as directed.   Avoid sexual intercourse if it causes pain.   Apply warm or cold compresses to the lower abdomen depending on which one helps the pain.   Avoid stressful situations.   Keep a journal of your pelvic pain. Write down when it started, where the pain is located, and if there are things that seem to be associated with the pain, such as food or your menstrual cycle.  Follow up with your caregiver as directed.  SEEK MEDICAL CARE IF:  Your medicine does not help your pain.  You have abnormal vaginal discharge. SEEK IMMEDIATE MEDICAL CARE IF:   You have heavy bleeding from the vagina.   Your pelvic pain increases.   You feel light-headed or faint.   You have chills.   You have pain with urination or blood in your urine.   You have uncontrolled diarrhea   or vomiting.   You have a fever or persistent symptoms for more than 3 days.  You have a fever and your symptoms suddenly get worse.   You are being physically or sexually abused.  MAKE SURE YOU:  Understand these instructions.  Will watch your condition.  Will get help if you are not doing well or get worse. Document Released:  01/10/2004 Document Revised: 06/29/2013 Document Reviewed: 06/04/2011 [facility] Patient Information 2015 [place], [place]. This information is not intended to replace advice given to you by your health care provider. Make sure you discuss any questions you have with your health care provider.    Lumbosacral Strain Lumbosacral strain is a strain of any of the parts that make up your lumbosacral vertebrae. Your lumbosacral vertebrae are the bones that make up the lower third of your backbone. Your lumbosacral vertebrae are held together by muscles and tough, fibrous tissue (ligaments).  CAUSES  A sudden blow to your back can cause lumbosacral strain. Also, anything that causes an excessive stretch of the muscles in the low back can cause this strain. This is typically seen when people exert themselves strenuously, fall, lift heavy objects, bend, or crouch repeatedly. RISK FACTORS  Physically demanding work.  Participation in pushing or pulling sports or sports that require a sudden twist of the back (tennis, golf, baseball).  Weight lifting.  Excessive lower back curvature.  Forward-tilted pelvis.  Weak back or abdominal muscles or both.  Tight hamstrings. SIGNS AND SYMPTOMS  Lumbosacral strain may cause pain in the area of your injury or pain that moves (radiates) down your leg.  DIAGNOSIS Your health care provider can often diagnose lumbosacral strain through a physical exam. In some cases, you may need tests such as X-ray exams.  TREATMENT  Treatment for your lower back injury depends on many factors that your clinician will have to evaluate. However, most treatment will include the use of anti-inflammatory medicines. HOME CARE INSTRUCTIONS   Avoid hard physical activities (tennis, racquetball, waterskiing) if you are not in proper physical condition for it. This may aggravate or create problems.  If you have a back problem, avoid sports requiring sudden body movements. Swimming and  walking are generally safer activities.  Maintain good posture.  Maintain a healthy weight.  For acute conditions, you may put ice on the injured area.  Put ice in a plastic bag.  Place a towel between your skin and the bag.  Leave the ice on for 20 minutes, 2-3 times a day.  When the low back starts healing, stretching and strengthening exercises may be recommended. SEEK MEDICAL CARE IF:  Your back pain is getting worse.  You experience severe back pain not relieved with medicines. SEEK IMMEDIATE MEDICAL CARE IF:   You have numbness, tingling, weakness, or problems with the use of your arms or legs.  There is a change in bowel or bladder control.  You have increasing pain in any area of the body, including your belly (abdomen).  You notice shortness of breath, dizziness, or feel faint.  You feel sick to your stomach (nauseous), are throwing up (vomiting), or become sweaty.  You notice discoloration of your toes or legs, or your feet get very cold. MAKE SURE YOU:   Understand these instructions.  Will watch your condition.  Will get help right away if you are not doing well or get worse. Document Released: 11/22/2004 Document Revised: 02/17/2013 Document Reviewed: 10/01/2012 [facility] Patient Information 2015 [place], [place]. This information is not intended  to replace advice given to you by your health care provider. Make sure you discuss any questions you have with your health care provider.  

## 2013-11-06 NOTE — Progress Notes (Signed)
Subjective:    Patient ID: Brooke Sexton, female    DOB: 02-Aug-1980, 33 y.o.   MRN: [id number]  HPI  This 33 y.o. AA female is here for eval of intermittent LBP/ R posterior hip pain for several months. 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 She has episodes of back pain that can last 2 or more days; Aleve provides temporary relief.  Pt also has R adnexal pressure and hx of R ovarian cyst. Last menses was normal duration but was heavy. She denies vag discharge or perineal lesions. States bowel movements are not normal; chronic problems w/ constipation. GI manages this condition.  Patient Active Problem List   Diagnosis Date Noted  . Vitamin D deficiency 01/21/2013  . History of anemia 12/17/2012  . Unspecified constipation 12/17/2012    Prior to Admission medications   Medication Sig Start Date End Date Taking? Authorizing Provider  ergocalciferol (DRISDOL) 50000 UNITS capsule Take 1 capsule (50,000 Units total) by mouth once a week. 12/23/12 12/23/13 Yes Maurice March, MD  Linaclotide [facility]) 145 MCG CAPS capsule Take 1 capsule (145 mcg total) by mouth daily. 01/21/13   Beverley Fiedler, MD  Simethicone (GAS-X PO) Take by mouth as needed.    Historical Provider, MD    History   Social History  . Marital Status: Single    Spouse Name: N/A    Number of Children: N/A  . Years of Education: N/A   Occupational History  . Not on file.   Social History Main Topics  . Smoking status: Never Smoker   . Smokeless tobacco: Never Used  . Alcohol Use: No  . Drug Use: No  . Sexual Activity: Not on file   Other Topics Concern  . Not on file   Social History Narrative   Single   College   Mental Health Profession          Family History  Problem Relation Age of Onset  . Diabetes Mother   . Hypertension Mother   . Mental illness Mother   . Mental illness Father   . Breast cancer Paternal  Grandmother   . Diabetes Paternal Grandfather   . Colon cancer Neg Hx       Review of Systems  Constitutional: Negative for fever, diaphoresis, activity change and fatigue.  Respiratory: Negative.   Cardiovascular: Negative.   Gastrointestinal: Positive for abdominal pain and constipation. Negative for nausea, vomiting, blood in stool and rectal pain.  Endocrine: Negative.   Genitourinary: Positive for menstrual problem and pelvic pain. Negative for vaginal discharge.  Musculoskeletal: Positive for back pain. Negative for arthralgias, gait problem and myalgias.  Skin: Negative.   Neurological: Negative.   Psychiatric/Behavioral: Negative.        Objective:   Physical Exam  Nursing note and vitals reviewed. Constitutional: She is oriented to person, place, and time. She appears well-developed and well-nourished. No distress.  HENT:  Head: Normocephalic and atraumatic.  Right Ear: External ear normal.  Left Ear: External ear normal.  Nose: Nose normal.  Mouth/Throat: Oropharynx is clear and moist.  Eyes: Conjunctivae and EOM are normal. Pupils are equal, round, and reactive to light. No scleral icterus.  Neck: Normal range of motion. Neck supple. No thyromegaly present.  Cardiovascular: Normal rate, regular rhythm and normal heart sounds.  Exam reveals no gallop and no friction rub.   No murmur heard. Pulmonary/Chest: Effort normal and  breath sounds normal. No respiratory distress.  Abdominal: Soft. Bowel sounds are normal. She exhibits no distension and no mass. There is no tenderness. There is no guarding.  Genitourinary: Vagina normal and uterus normal. There is no rash, tenderness or lesion on the right labia. There is no rash, tenderness or lesion on the left labia. Cervix exhibits no motion tenderness, no discharge and no friability. Right adnexum displays tenderness. Right adnexum displays no mass and no fullness. Left adnexum displays tenderness. Left adnexum displays no  mass and no fullness. No erythema, tenderness or bleeding around the vagina. No signs of injury around the vagina. No vaginal discharge found.  Musculoskeletal: Normal range of motion. She exhibits no edema.       Cervical back: Normal.       Thoracic back: Normal.       Lumbar back: She exhibits tenderness and spasm. She exhibits no bony tenderness, no deformity and no pain.  Major joints w/o deformities, effusion or redness, FROM.  Lymphadenopathy:    She has no cervical adenopathy.       Right: No inguinal adenopathy present.       Left: No inguinal adenopathy present.  Neurological: She is alert and oriented to person, place, and time. She has normal reflexes. No cranial nerve deficit. She exhibits normal muscle tone. Coordination normal.  Skin: Skin is warm and dry. No rash noted. She is not diaphoretic. No erythema.  Psychiatric: She has a normal mood and affect. Her behavior is normal. Judgment and thought content normal.    [facility] reading (PRIMARY) by  Dr. Audria Nine: LS spine films- Normal alignment with normal disc spacing; mild degenerative changes in R SI joint. No acute changes. Increased lumbar lordosis.     Assessment & Plan:  Right-sided low back pain without sciatica -Rial of medications- listed below. Improve seating in vehicle/ increase lumbar support. Consider PT if no improvement or if symptoms worsen. Plan: DG Lumbar Spine Complete  Pelvic pressure in female - Plan: US Pelvis Complete.  Adnexal pain - Plan: US Pelvis Complete  History of ovarian cyst  Meds ordered this encounter  Medications  . meloxicam (MOBIC) 7.5 MG tablet    Sig: Take 1 tablet (7.5 mg total) by mouth daily.    Dispense:  30 tablet    Refill:  2  . cyclobenzaprine (FLEXERIL) 5 MG tablet    Sig: Take 1 tablet (5 mg total) by mouth at bedtime.    Dispense:  30 tablet    Refill:  1

## 2013-11-08 ENCOUNTER — Encounter: Payer: Self-pay | Admitting: Family Medicine

## 2013-11-08 DIAGNOSIS — M545 Low back pain, unspecified: Secondary | ICD-10-CM | POA: Insufficient documentation

## 2013-12-04 ENCOUNTER — Other Ambulatory Visit: Payer: 59

## 2013-12-16 ENCOUNTER — Ambulatory Visit
Admission: RE | Admit: 2013-12-16 | Discharge: 2013-12-16 | Disposition: A | Discharge status: Home / Self Care | Payer: 59 | Source: Ambulatory Visit | Attending: Family Medicine | Admitting: Family Medicine

## 2013-12-16 DIAGNOSIS — R102 Pelvic and perineal pain: Secondary | ICD-10-CM

## 2015-02-02 ENCOUNTER — Ambulatory Visit (INDEPENDENT_AMBULATORY_CARE_PROVIDER_SITE_OTHER): Payer: Self-pay | Admitting: Neurology

## 2015-02-02 ENCOUNTER — Encounter: Payer: Self-pay | Admitting: Neurology

## 2015-02-02 VITALS — BP 109/69 | HR 81 | Resp 16 | Ht 62.0 in | Wt 135.0 lb

## 2015-02-02 DIAGNOSIS — H9312 Tinnitus, left ear: Secondary | ICD-10-CM

## 2015-02-02 DIAGNOSIS — R42 Dizziness and giddiness: Secondary | ICD-10-CM

## 2015-02-02 NOTE — Progress Notes (Signed)
Subjective:    Patient ID: Brooke Sexton is a 34 y.o. female.  HPI     [name], MD, PhD [facility] [address] [address] [place], [place] [address]  Dear Dr. Yetta Barre,   I saw your patient, procedure [place], upon your kind request in my neurologic clinic today for initial consultation of her dizziness, associated with nausea. The patient is unaccompanied today. As you know, Brooke Sexton is a 34 year old right-handed woman with an underlying medical history of anemia, constipation, vitamin D deficiency, and recent pharyngitis, and recent diagnosis of Eustachian tube dysfunction in September 2016, treated symptomatically with Zofran and meclizine, who reports recurrent vertiginous symptoms since 9/16. She had her first symptoms in September, tried Zyrtec and Meclizine, with some improvement. She took a plane trip to [place] and also a longer bus ride to [facility] in September and felt worse. She symptomatically treated her symptoms with meclizine at the time. Currently she feels fairly well. She has intermittent mild and very fleeting symptoms of dizziness. Sometimes she feels off-balance. She has no history of hearing loss. She has no personal history of migraines. I reviewed your office note from 01/05/2015, which you kindly included.  She has a history of intermittent tinnitus on the left, not new. She has had no falls. She works FT as an Chiropodist, does not smoke or drink alcohol and no caffeine. She does not typically drink enough water, averaging about 32 oz/d. She snores but denies any apneic breathing pauses. She has never been told that she has apneas. She lives with her sister.   Her Past Medical History Is Significant For: Past Medical History  Diagnosis Date  . Allergy   . Depression   . Anemia   . Anxiety     Her Past Surgical History Is Significant For: No past surgical history on file.  Her Family History Is Significant  For: Family History  Problem Relation Age of Onset  . Diabetes Mother   . Hypertension Mother   . Mental illness Mother   . Mental illness Father   . Breast cancer Paternal Grandmother   . Diabetes Paternal Grandfather   . Colon cancer Neg Hx   . Hypertension Maternal Aunt   . Diabetes Maternal Aunt   . Prostate cancer Maternal Uncle   . Breast cancer Maternal Grandmother     Her Social History Is Significant For: Social History   Social History  . Marital Status: Single    Spouse Name: N/A  . Number of Children: N/A  . Years of Education: [employer]   Social History Main Topics  . Smoking status: Never Smoker   . Smokeless tobacco: Never Used  . Alcohol Use: No  . Drug Use: No  . Sexual Activity: Not Asked   Other Topics Concern  . None   Social History Narrative   Interior and spatial designer Profession   Denies caffeine use       Her Allergies Are:  No Known Allergies:   Her Current Medications Are:  Outpatient Encounter Prescriptions as of 02/02/2015  Medication Sig  . cetirizine (ZYRTEC) 10 MG tablet Take 10 mg by mouth daily.  . meclizine (ANTIVERT) 25 MG tablet Take 25 mg by mouth 3 (three) times daily as needed for dizziness.   No facility-administered encounter medications on file as of 02/02/2015.  :   Review of Systems:  Out of a complete 14 point review of systems, all  are reviewed and negative with the exception of these symptoms as listed below:   Review of Systems  Neurological:       Patient reports that dizziness started in September 2016. Had dizziness at work and "lime green" stool. She was treated by doctor at urgent care and was told it was an inner ear and sinus problem. Zyrtec and meclizine relieve her symptoms.     Objective:  Neurologic Exam  Physical Exam Physical Examination:   Filed Vitals:   02/02/15 1310 02/02/15 1318  BP: 99/59 109/69  Pulse: 79 81  Resp: 16 16    General Examination: The patient is a very  pleasant 34 y.o. female in no acute distress. She appears well-developed and well-nourished and well groomed. She is mildly anxious appearing. She has no orthostatic blood pressure drop.  HEENT: Normocephalic, atraumatic, pupils are equal, round and reactive to light and accommodation. Funduscopic exam is normal with sharp disc margins noted. Extraocular tracking is good without limitation to gaze excursion or nystagmus noted. Normal smooth pursuit is noted. Hearing is grossly intact. She has no vertiginous symptoms upon changes of her body position or sudden changes in her head position today. Tympanic membranes are clear bilaterally. Face is symmetric with normal facial animation and normal facial sensation. Speech is clear with no dysarthria noted. There is no hypophonia. There is no lip, neck/head, jaw or voice tremor. Neck is supple with full range of passive and active motion. There are no carotid bruits on auscultation. Oropharynx exam reveals: mild mouth dryness, good dental hygiene and moderate airway crowding, due to  elongated uvula, narrow airway entry and larger tonsils. Mallampati is class I. Tongue protrudes centrally and palate elevates symmetrically.    Chest: Clear to auscultation without wheezing, rhonchi or crackles noted.  Heart: S1+S2+0, regular and normal without murmurs, rubs or gallops noted.   Abdomen: Soft, non-tender and non-distended with normal bowel sounds appreciated on auscultation.  Extremities: There is no pitting edema in the distal lower extremities bilaterally. Pedal pulses are intact.  Skin: Warm and dry without trophic changes noted. There are no varicose veins.  Musculoskeletal: exam reveals no obvious joint deformities, tenderness or joint swelling or erythema.   Neurologically:  Mental status: The patient is awake, alert and oriented in all 4 spheres. Her immediate and remote memory, attention, language skills and fund of knowledge are appropriate. There is  no evidence of aphasia, agnosia, apraxia or anomia. Speech is clear with normal prosody and enunciation. Thought process is linear. Mood is normal and affect is constricted and she is anxious.  Cranial nerves II - XII are as described above under HEENT exam. In addition: shoulder shrug is normal with equal shoulder height noted. Motor exam: Normal bulk, strength and tone is noted. There is no drift, tremor or rebound. Romberg is negative. Reflexes are 2+ throughout. Babinski: Toes are flexor bilaterally. Fine motor skills and coordination: intact with normal finger taps, normal hand movements, normal rapid alternating patting, normal foot taps and normal foot agility.  Cerebellar testing: No dysmetria or intention tremor on finger to nose testing. Heel to shin is unremarkable bilaterally. There is no truncal or gait ataxia.  Sensory exam: intact to light touch, pinprick, vibration, temperature sense in the upper and lower extremities.  Gait, station and balance: She stands easily. No veering to one side is noted. No leaning to one side is noted. Posture is age-appropriate and stance is narrow based. Gait shows normal stride length and normal pace. No  problems turning are noted. She turns en bloc. Tandem walk is unremarkable.               Assessment and Plan:   In summary, Ellayna CLOTHILDE TIPPETTS is a very pleasant 34 y.o.-year old female with an underlying medical history of anemia, constipation, vitamin D deficiency, and recent pharyngitis, who presents with a history of recurrent vertiginous symptoms. She has intermittent left-sided tinnitus as well. Her exam is benign. She has no vertigo at this time. She is explained that she most likely has positional vertigo which can be exacerbated by congestion, cold symptoms, or any viral infection. She does not hydrate well enough and is encouraged to drink more water. She is encouraged to continue to stay active but is advised that symptoms may return without warning. I  suggested we proceed with a brain MRI without contrast and we we'll call her with her test results. I will see her routinely in follow-up in 3-4 months, sooner if needed. We may consider referral to an ENT specialist next if needed. She is reassured that her neurological exam is normal.  I explained to her that there is no specific treatment for his symptoms. Sometimes meclizine helps but can be quite sedating.  I answered all her questions today and the patient was in agreement with the plan.  Thank you very much for allowing me to participate in the care of this nice patient. If I can be of any further assistance to you please do not hesitate to call me at [phone].  Sincerely,   [name], MD, PhD

## 2015-02-02 NOTE — Patient Instructions (Signed)
Please remember, that vertigo can recur without warning, triggers could be stress, sleep deprivation, dehydration and even taking a bumpy car ride. It can last hours or days. Please change positions slowly and always stay well-hydrated. Physical therapy with particular attention to vestibular rehabilitation can be very helpful. While there is no specific medication that helps with vertigo, some people get relief with as needed use of meclizine. Certain medications can exacerbate vertigo.   You need to drink more water, 6-8 glasses (8 oz each) would be better and may help your symptoms.   We will do a brain scan, called MRI and call you with the test results. We will have to schedule you for this on a separate date. This test requires authorization from your insurance, and we will take care of the insurance process.

## 2015-03-09 ENCOUNTER — Other Ambulatory Visit: Payer: 59

## 2015-04-06 ENCOUNTER — Ambulatory Visit
Admission: RE | Admit: 2015-04-06 | Discharge: 2015-04-06 | Disposition: A | Discharge status: Home / Self Care | Payer: No Typology Code available for payment source | Source: Ambulatory Visit | Attending: Neurology | Admitting: Neurology

## 2015-04-06 DIAGNOSIS — H9312 Tinnitus, left ear: Secondary | ICD-10-CM

## 2015-04-06 DIAGNOSIS — R42 Dizziness and giddiness: Secondary | ICD-10-CM

## 2015-06-01 ENCOUNTER — Ambulatory Visit: Payer: Self-pay | Admitting: Neurology

## 2015-08-10 IMAGING — US US PELVIS COMPLETE
1 series · 14 of 25 positions shown · non-contrast
Comparison: Pelvic ultrasound 08/18/2007

CLINICAL DATA: Adnexal pain and pressure



[Series 1: us pelvis complete · 0.19mm/px · 14 of 69 slices shown]
[im 1/69]
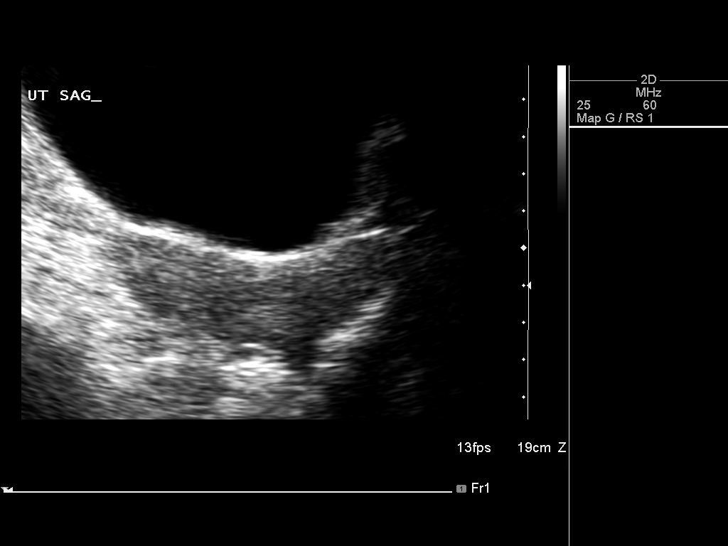
[im 6/69]
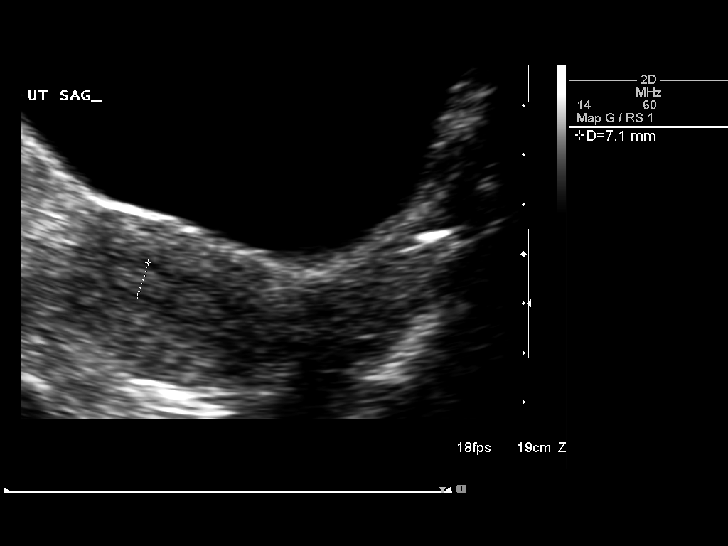
[im 12/69]
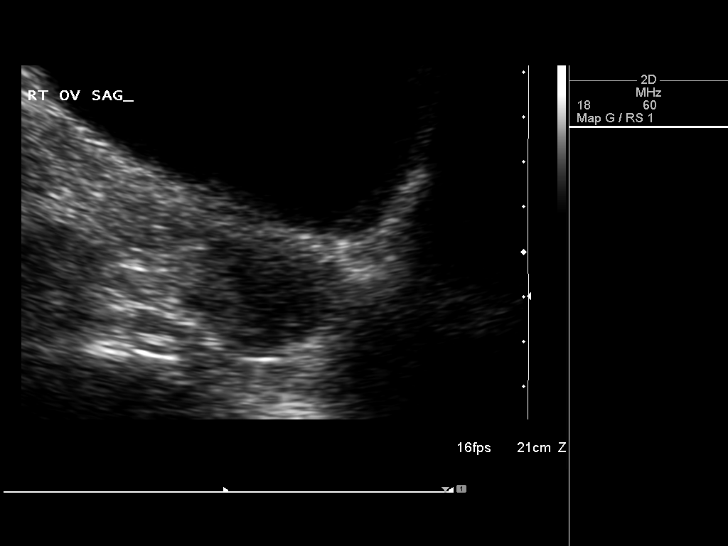
[im 18/69]
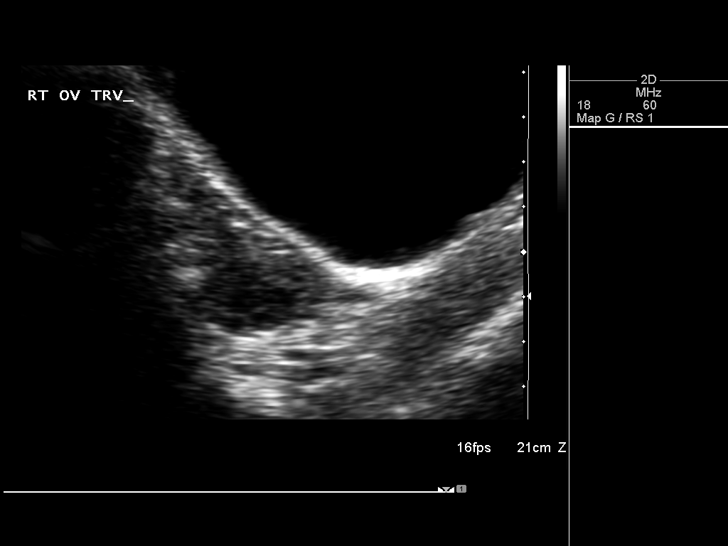
[im 23/69]
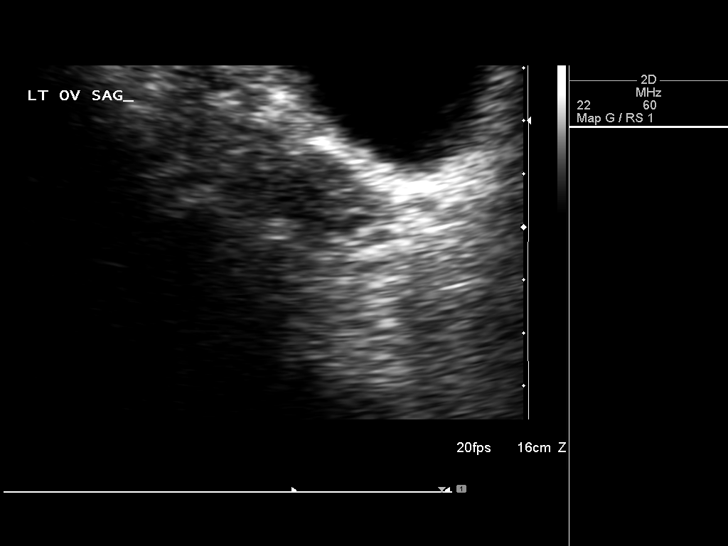
[im 26/69]
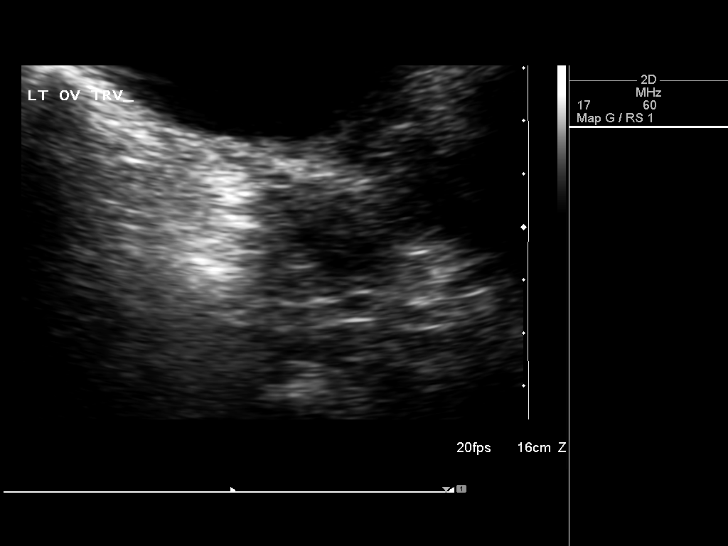
[im 32/69]
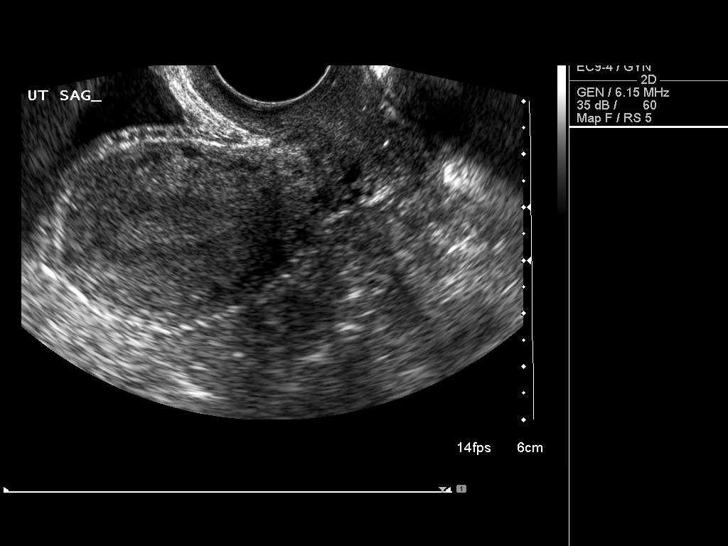
[im 37/69]
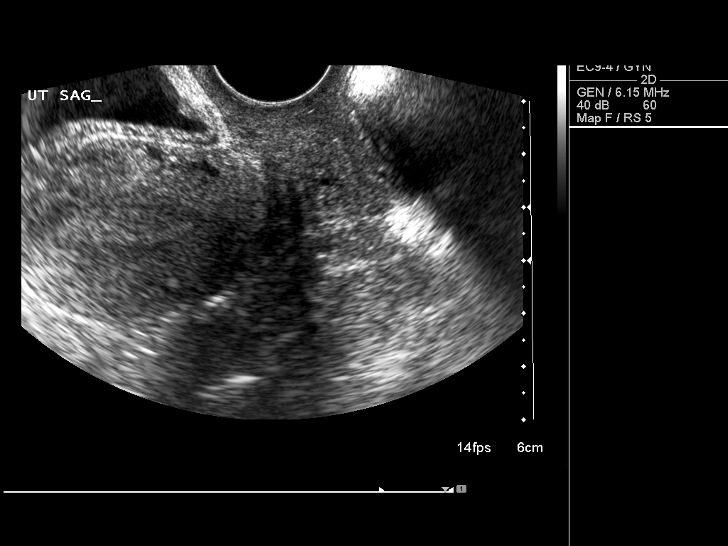
[im 43/69]
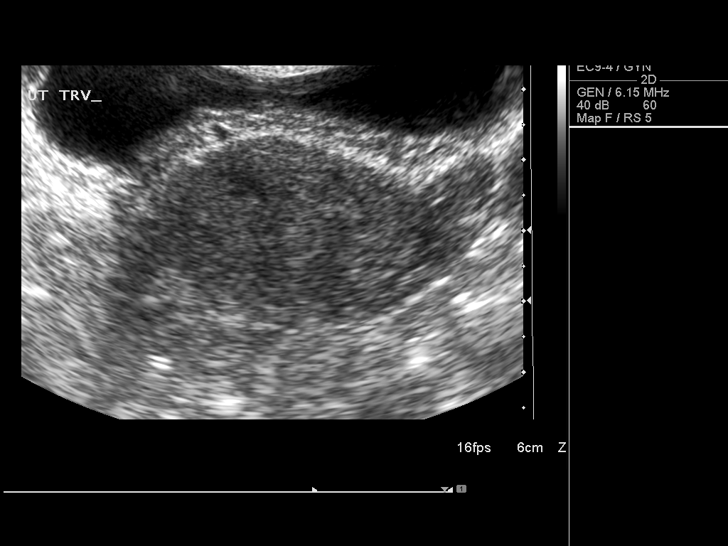
[im 46/69]
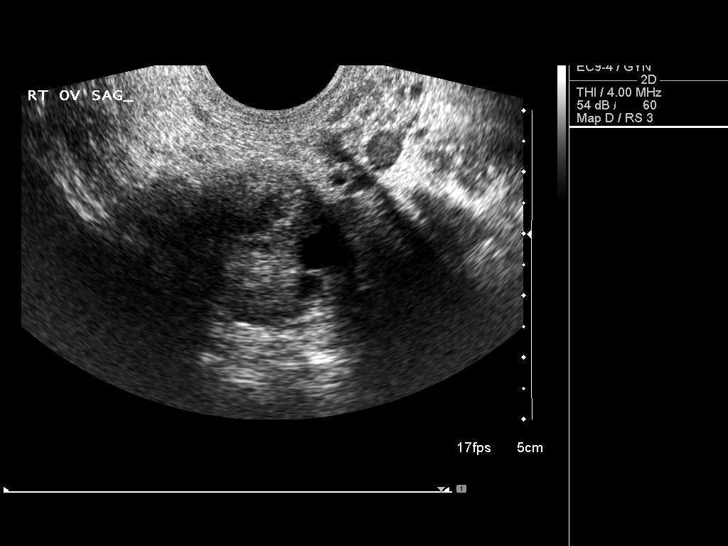
[im 52/69]
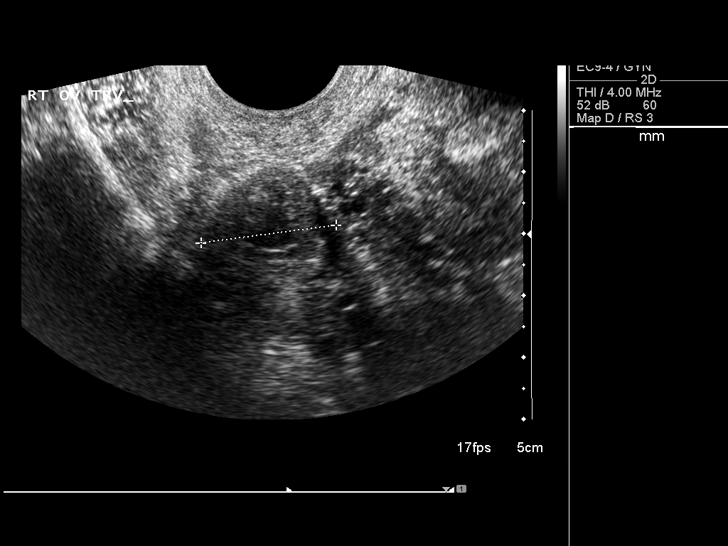
[im 57/69]
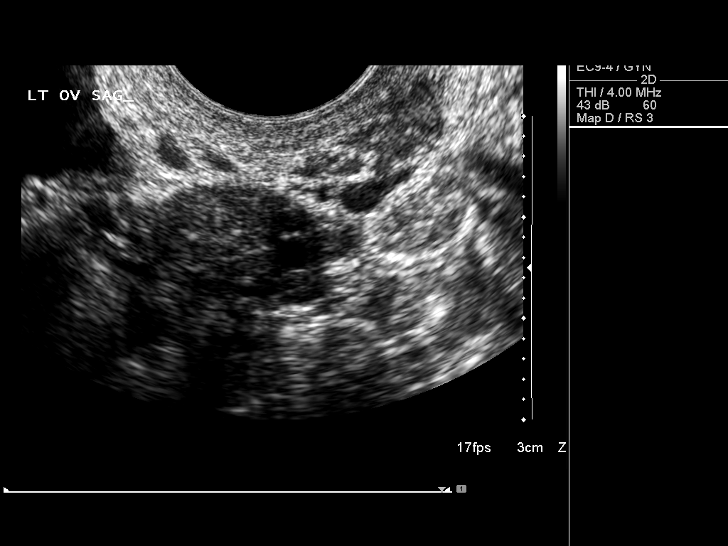
[im 63/69]
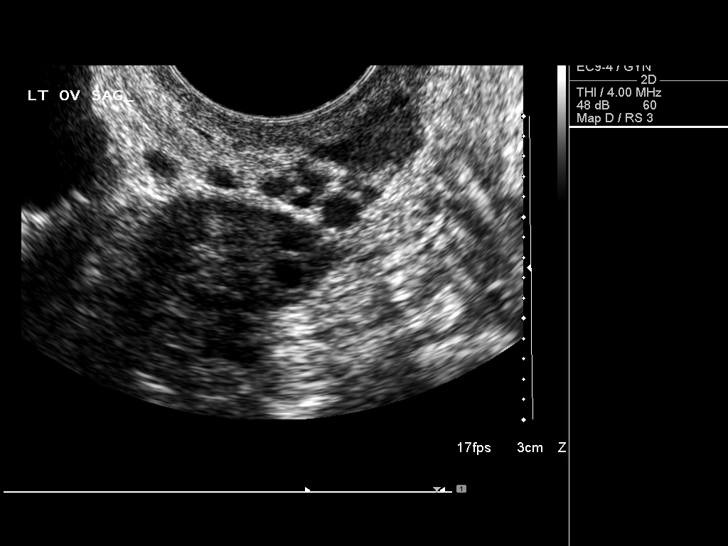
[im 69/69]
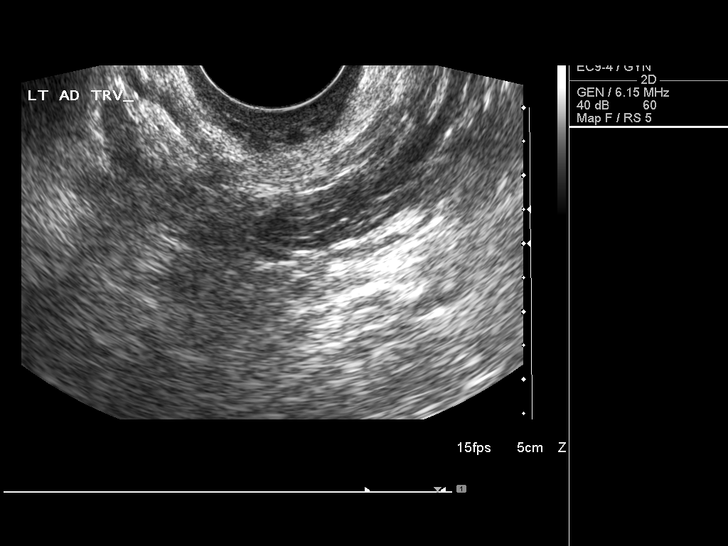

[14 of 25 positions shown; findings below may reference images not displayed]

FINDINGS: Uterus

Measurements: 7.4 x 3.1 x 4.6 cm. No fibroids or other mass
visualized.

Endometrium

Thickness: 7 mm in thickness.  No focal abnormality visualized.

Right ovary

Measurements: 3.7 x 2.7 x 2.2 cm. Normal appearance/no adnexal mass.

Left ovary

Measurements: 3.0 x 2.0 x 1.8 cm. Normal appearance/no adnexal mass.

Other findings

No free fluid.
IMPRESSION: Unremarkable pelvic ultrasound.

## 2021-04-25 ENCOUNTER — Other Ambulatory Visit: Payer: Self-pay | Admitting: Advanced Practice Midwife

## 2021-05-04 ENCOUNTER — Other Ambulatory Visit: Payer: Self-pay | Admitting: Advanced Practice Midwife

## 2021-05-04 DIAGNOSIS — Z1231 Encounter for screening mammogram for malignant neoplasm of breast: Secondary | ICD-10-CM

## 2021-06-12 ENCOUNTER — Ambulatory Visit
Admission: RE | Admit: 2021-06-12 | Discharge: 2021-06-12 | Disposition: A | Discharge status: Home / Self Care | Payer: 59 | Source: Ambulatory Visit | Attending: Advanced Practice Midwife | Admitting: Advanced Practice Midwife

## 2021-06-12 DIAGNOSIS — Z1231 Encounter for screening mammogram for malignant neoplasm of breast: Secondary | ICD-10-CM

## 2022-05-21 ENCOUNTER — Other Ambulatory Visit: Payer: Self-pay | Admitting: Family Medicine

## 2022-05-21 DIAGNOSIS — Z1231 Encounter for screening mammogram for malignant neoplasm of breast: Secondary | ICD-10-CM

## 2022-05-22 ENCOUNTER — Telehealth: Payer: Self-pay

## 2022-05-22 NOTE — Telephone Encounter (Signed)
Patient called and will complete mammogram scholarship application and fax to the office.

## 2022-05-30 ENCOUNTER — Telehealth: Payer: Self-pay

## 2022-05-30 NOTE — Telephone Encounter (Signed)
Telephoned patient at mobile number. Left a voice message with [facility] (scholarship) contact information. 

## 2022-06-04 ENCOUNTER — Other Ambulatory Visit: Payer: Self-pay | Admitting: Family Medicine

## 2022-06-04 DIAGNOSIS — Z1231 Encounter for screening mammogram for malignant neoplasm of breast: Secondary | ICD-10-CM

## 2022-07-12 ENCOUNTER — Ambulatory Visit
Admission: RE | Admit: 2022-07-12 | Discharge: 2022-07-12 | Disposition: A | Discharge status: Home / Self Care | Payer: No Typology Code available for payment source | Source: Ambulatory Visit | Attending: Family Medicine | Admitting: Family Medicine

## 2022-07-12 DIAGNOSIS — Z1231 Encounter for screening mammogram for malignant neoplasm of breast: Secondary | ICD-10-CM

## 2022-07-17 ENCOUNTER — Other Ambulatory Visit: Payer: Self-pay | Admitting: Obstetrics and Gynecology

## 2022-07-17 DIAGNOSIS — R928 Other abnormal and inconclusive findings on diagnostic imaging of breast: Secondary | ICD-10-CM

## 2022-07-18 ENCOUNTER — Encounter: Payer: Self-pay | Admitting: Obstetrics and Gynecology

## 2022-08-07 ENCOUNTER — Other Ambulatory Visit: Payer: No Typology Code available for payment source

## 2022-08-07 ENCOUNTER — Ambulatory Visit: Payer: No Typology Code available for payment source

## 2022-08-14 ENCOUNTER — Ambulatory Visit: Payer: Self-pay | Admitting: Hematology and Oncology

## 2022-08-14 VITALS — BP 94/52 | Wt 123.0 lb

## 2022-08-14 DIAGNOSIS — N6489 Other specified disorders of breast: Secondary | ICD-10-CM

## 2022-08-14 NOTE — Progress Notes (Signed)
[name] is a 42 y.o. female who presents to [facility] clinic today with no complaints. Call back for possible right breast asymmetry.   Pap Smear: Pap not smear completed today. Last Pap smear was 2023 at [facility] and was normal. Per patient has no history of an abnormal Pap smear. Last Pap smear result is available in Epic.   Physical exam: Breasts Breasts symmetrical. No skin abnormalities bilateral breasts. No nipple retraction bilateral breasts. No nipple discharge bilateral breasts. No lymphadenopathy. No lumps palpated bilateral breasts.   MS 3D SCR MAMMO BILAT BR (aka MM)  Result Date: 07/16/2022 CLINICAL DATA:  Screening. EXAM: DIGITAL SCREENING BILATERAL MAMMOGRAM WITH TOMOSYNTHESIS AND CAD TECHNIQUE: Bilateral screening digital craniocaudal and mediolateral oblique mammograms were obtained. Bilateral screening digital breast tomosynthesis was performed. The images were evaluated with computer-aided detection. COMPARISON:  Previous exam(s). ACR Breast Density Category c: The breasts are heterogeneously dense, which may obscure small masses. FINDINGS: In the right breast, a possible asymmetry warrants further evaluation. In the left breast, no findings suspicious for malignancy. IMPRESSION: Further evaluation is suggested for possible asymmetry in the right breast. RECOMMENDATION: Diagnostic mammogram and possibly ultrasound of the right breast. (Code:[id number]-36M) The patient will be contacted regarding the findings, and additional imaging will be scheduled. BI-RADS CATEGORY  0: Incomplete: Need additional imaging evaluation. Electronically Signed   By: Baird Lyons M.D.   On: 07/16/2022 09:34   MM 3D SCREEN BREAST BILATERAL  Result Date: 06/12/2021 CLINICAL DATA:  Screening. EXAM: DIGITAL SCREENING BILATERAL MAMMOGRAM WITH TOMOSYNTHESIS AND CAD TECHNIQUE: Bilateral screening digital craniocaudal and mediolateral oblique mammograms were obtained. Bilateral screening digital  breast tomosynthesis was performed. The images were evaluated with computer-aided detection. COMPARISON:  None. ACR Breast Density Category c: The breast tissue is heterogeneously dense, which may obscure small masses FINDINGS: There are no findings suspicious for malignancy. IMPRESSION: No mammographic evidence of malignancy. A result letter of this screening mammogram will be mailed directly to the patient. RECOMMENDATION: Screening mammogram in one year. (Code:[id number]) BI-RADS CATEGORY  1: Negative. Electronically Signed   By: Frederico Hamman M.D.   On: 06/12/2021 15:59        Pelvic/Bimanual Pap is not indicated today    Smoking History: Patient has never smoked and was not referred to quit line.    Patient Navigation: Patient education provided. Access to services provided for patient through [facility] program. Natale Lay interpreter provided. No transportation provided   Colorectal Cancer Screening: Per patient has never had colonoscopy completed No complaints today.    Breast and Cervical Cancer Risk Assessment: Patient has family history of breast cancer, maternal grandmother. Patient does not have history of cervical dysplasia, immunocompromised, or DES exposure in-utero.  Risk Scores as of 08/14/2022     Brooke Sexton           5-year 0.69 %   Lifetime 9.5 %            Last calculated by Caprice Red, CMA on 08/14/2022 at  8:53 AM        A: [facility] exam without pap smear No complaints with benign exam. Call back for possible right breast asymmetry.   P: Referred patient to the [facility] for a diagnostic mammogram. Appointment scheduled 08/14/22.  Brooke Sexton A, NP 08/14/2022 9:02 AM

## 2022-08-14 NOTE — Patient Instructions (Addendum)
Taught Brooke Sexton about self breast awareness and gave educational materials to take home. Patient did not need a Pap smear today due to last Pap smear was in 2023 per patient. Let her know [facility] will cover Pap smears every 5 years unless has a history of abnormal Pap smears. Referred patient to the [facility] for diagnostic mammogram. Appointment scheduled for 08/14/22. Patient aware of appointment and will be there. Let patient know will follow up with her within the next couple weeks with results. Brooke Sexton verbalized understanding.  Pascal Lux, NP 8:44 AM

## 2022-08-15 ENCOUNTER — Ambulatory Visit
Admission: RE | Admit: 2022-08-15 | Discharge: 2022-08-15 | Disposition: A | Discharge status: Home / Self Care | Payer: No Typology Code available for payment source | Source: Ambulatory Visit | Attending: Obstetrics and Gynecology | Admitting: Obstetrics and Gynecology

## 2022-08-15 ENCOUNTER — Ambulatory Visit: Payer: No Typology Code available for payment source

## 2022-08-15 DIAGNOSIS — R928 Other abnormal and inconclusive findings on diagnostic imaging of breast: Secondary | ICD-10-CM

## 2023-04-01 DIAGNOSIS — J3089 Other allergic rhinitis: Secondary | ICD-10-CM | POA: Diagnosis not present

## 2023-04-01 DIAGNOSIS — J301 Allergic rhinitis due to pollen: Secondary | ICD-10-CM | POA: Diagnosis not present

## 2023-04-01 DIAGNOSIS — J3081 Allergic rhinitis due to animal (cat) (dog) hair and dander: Secondary | ICD-10-CM | POA: Diagnosis not present

## 2023-04-01 DIAGNOSIS — H1045 Other chronic allergic conjunctivitis: Secondary | ICD-10-CM | POA: Diagnosis not present

## 2023-05-10 DIAGNOSIS — Z0001 Encounter for general adult medical examination with abnormal findings: Secondary | ICD-10-CM | POA: Diagnosis not present

## 2023-05-10 DIAGNOSIS — F321 Major depressive disorder, single episode, moderate: Secondary | ICD-10-CM | POA: Diagnosis not present

## 2023-05-10 DIAGNOSIS — F419 Anxiety disorder, unspecified: Secondary | ICD-10-CM | POA: Diagnosis not present

## 2023-05-10 DIAGNOSIS — F5101 Primary insomnia: Secondary | ICD-10-CM | POA: Diagnosis not present

## 2023-05-10 DIAGNOSIS — G4733 Obstructive sleep apnea (adult) (pediatric): Secondary | ICD-10-CM | POA: Diagnosis not present

## 2023-06-19 DIAGNOSIS — Z111 Encounter for screening for respiratory tuberculosis: Secondary | ICD-10-CM | POA: Diagnosis not present

## 2023-06-22 DIAGNOSIS — Z111 Encounter for screening for respiratory tuberculosis: Secondary | ICD-10-CM | POA: Diagnosis not present

## 2023-09-06 ENCOUNTER — Other Ambulatory Visit: Payer: Self-pay | Admitting: Obstetrics and Gynecology

## 2023-09-06 DIAGNOSIS — Z1231 Encounter for screening mammogram for malignant neoplasm of breast: Secondary | ICD-10-CM

## 2023-09-20 ENCOUNTER — Ambulatory Visit
Admission: RE | Admit: 2023-09-20 | Discharge: 2023-09-20 | Disposition: A | Discharge status: Home / Self Care | Payer: Self-pay | Source: Ambulatory Visit | Attending: Obstetrics and Gynecology | Admitting: Obstetrics and Gynecology

## 2023-09-20 DIAGNOSIS — Z1231 Encounter for screening mammogram for malignant neoplasm of breast: Secondary | ICD-10-CM

## 2023-10-02 DIAGNOSIS — K5901 Slow transit constipation: Secondary | ICD-10-CM | POA: Diagnosis not present

## 2023-10-02 DIAGNOSIS — K625 Hemorrhage of anus and rectum: Secondary | ICD-10-CM | POA: Diagnosis not present

## 2023-10-10 DIAGNOSIS — Z124 Encounter for screening for malignant neoplasm of cervix: Secondary | ICD-10-CM | POA: Diagnosis not present

## 2023-10-10 DIAGNOSIS — Z0142 Encounter for cervical smear to confirm findings of recent normal smear following initial abnormal smear: Secondary | ICD-10-CM | POA: Diagnosis not present

## 2023-10-10 DIAGNOSIS — Z1151 Encounter for screening for human papillomavirus (HPV): Secondary | ICD-10-CM | POA: Diagnosis not present

## 2023-10-10 DIAGNOSIS — Z01419 Encounter for gynecological examination (general) (routine) without abnormal findings: Secondary | ICD-10-CM | POA: Diagnosis not present

## 2023-10-10 DIAGNOSIS — Z1272 Encounter for screening for malignant neoplasm of vagina: Secondary | ICD-10-CM | POA: Diagnosis not present

## 2023-12-17 DIAGNOSIS — N9089 Other specified noninflammatory disorders of vulva and perineum: Secondary | ICD-10-CM | POA: Diagnosis not present

## 2023-12-30 DIAGNOSIS — R21 Rash and other nonspecific skin eruption: Secondary | ICD-10-CM | POA: Diagnosis not present

## 2023-12-30 DIAGNOSIS — H1045 Other chronic allergic conjunctivitis: Secondary | ICD-10-CM | POA: Diagnosis not present

## 2023-12-30 DIAGNOSIS — J3089 Other allergic rhinitis: Secondary | ICD-10-CM | POA: Diagnosis not present

## 2023-12-30 DIAGNOSIS — R059 Cough, unspecified: Secondary | ICD-10-CM | POA: Diagnosis not present

## 2024-01-28 DIAGNOSIS — F4323 Adjustment disorder with mixed anxiety and depressed mood: Secondary | ICD-10-CM | POA: Diagnosis not present

## 2024-02-14 DIAGNOSIS — K644 Residual hemorrhoidal skin tags: Secondary | ICD-10-CM | POA: Diagnosis not present

## 2024-02-14 DIAGNOSIS — K648 Other hemorrhoids: Secondary | ICD-10-CM | POA: Diagnosis not present

## 2024-02-14 DIAGNOSIS — K625 Hemorrhage of anus and rectum: Secondary | ICD-10-CM | POA: Diagnosis not present

## 2024-02-14 DIAGNOSIS — K6389 Other specified diseases of intestine: Secondary | ICD-10-CM | POA: Diagnosis not present
